# Patient Record
Sex: Female | Born: 1990 | Race: Black or African American | Hispanic: No | Marital: Single | Smoking: Never smoker
Health system: Southern US, Community
[De-identification: ages and names within clinical notes are randomized; demographics above are authoritative.]

## PROBLEM LIST (undated history)

## (undated) DIAGNOSIS — R569 Unspecified convulsions: Secondary | ICD-10-CM

## (undated) HISTORY — PX: NO PAST SURGERIES: SHX2092

---
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
## 2008-01-11 ENCOUNTER — Emergency Department: Payer: Self-pay | Admitting: Internal Medicine

## 2015-08-04 ENCOUNTER — Ambulatory Visit
Admission: EM | Admit: 2015-08-04 | Discharge: 2015-08-04 | Disposition: A | Discharge status: Home / Self Care | Payer: BLUE CROSS/BLUE SHIELD | Attending: Family Medicine | Admitting: Family Medicine

## 2015-08-04 DIAGNOSIS — J029 Acute pharyngitis, unspecified: Secondary | ICD-10-CM

## 2015-08-04 HISTORY — DX: Unspecified convulsions: R56.9

## 2015-08-04 LAB — RAPID STREP SCREEN (MED CTR MEBANE ONLY): Streptococcus, Group A Screen (Direct): NEGATIVE

## 2015-08-04 NOTE — Discharge Instructions (Signed)
°  Tylenol and ibuprofen aternating  Increase fluids  Steamy showers Gargle with warm salt water or dilute mouthwash Rest   Call back 3 days for Strep final report    Pharyngitis Pharyngitis is redness, pain, and swelling (inflammation) of your pharynx.  CAUSES  Pharyngitis is usually caused by infection. Most of the time, these infections are from viruses (viral) and are part of a cold. However, sometimes pharyngitis is caused by bacteria (bacterial). Pharyngitis can also be caused by allergies. Viral pharyngitis may be spread from person to person by coughing, sneezing, and personal items or utensils (cups, forks, spoons, toothbrushes). Bacterial pharyngitis may be spread from person to person by more intimate contact, such as kissing.  SIGNS AND SYMPTOMS  Symptoms of pharyngitis include:   Sore throat.   Tiredness (fatigue).   Low-grade fever.   Headache.  Joint pain and muscle aches.  Skin rashes.  Swollen lymph nodes.  Plaque-like film on throat or tonsils (often seen with bacterial pharyngitis). DIAGNOSIS  Your health care provider will ask you questions about your illness and your symptoms. Your medical history, along with a physical exam, is often all that is needed to diagnose pharyngitis. Sometimes, a rapid strep test is done. Other lab tests may also be done, depending on the suspected cause.  TREATMENT  Viral pharyngitis will usually get better in 3-4 days without the use of medicine. Bacterial pharyngitis is treated with medicines that kill germs (antibiotics).  HOME CARE INSTRUCTIONS   Drink enough water and fluids to keep your urine clear or pale yellow.   Only take over-the-counter or prescription medicines as directed by your health care provider:   If you are prescribed antibiotics, make sure you finish them even if you start to feel better.   Do not take aspirin.   Get lots of rest.   Gargle with 8 oz of salt water ( tsp of salt per 1 qt of  water) as often as every 1-2 hours to soothe your throat.   Throat lozenges (if you are not at risk for choking) or sprays may be used to soothe your throat. SEEK MEDICAL CARE IF:   You have large, tender lumps in your neck.  You have a rash.  You cough up green, yellow-brown, or bloody spit. SEEK IMMEDIATE MEDICAL CARE IF:   Your neck becomes stiff.  You drool or are unable to swallow liquids.  You vomit or are unable to keep medicines or liquids down.  You have severe pain that does not go away with the use of recommended medicines.  You have trouble breathing (not caused by a stuffy nose). MAKE SURE YOU:   Understand these instructions.  Will watch your condition.  Will get help right away if you are not doing well or get worse.   This information is not intended to replace advice given to you by your health care provider. Make sure you discuss any questions you have with your health care provider.   Document Released: 06/13/2005 Document Revised: 04/03/2013 Document Reviewed: 02/18/2013 Elsevier Interactive Patient Education [employer]

## 2015-08-04 NOTE — ED Provider Notes (Signed)
CSN: [id number]     Arrival date & time 08/04/15  1140 History   First MD Initiated Contact with Patient 08/04/15 1207     Chief Complaint  Patient presents with  . Sore Throat   (Consider location/radiation/quality/duration/timing/severity/associated sxs/prior Treatment) HPI  25 yo F with sore throat since Saturday. Has uses ibuprofen and tylenol -reports fever of 101-102 during weekend  she denies hx of post nasal drip/seasonal allergies but mother feels she does a lot of throat clearing normally. Mom and dad both have allergies. 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 No follow up. Is aware of feeling weak , unable to move extremities, but can hear everybody - and then it passes And "I come back". Was never evaluated-no studies were done and no medications prescribed per mother and daughter  - was told to "get other activities and interventions to reduce stress"  Has never seen PCP Has a New appointmnt reportedly scheduled for May 9     Past Medical History  Diagnosis Date  . Seizures (HCC)    History reviewed. No pertinent past surgical history. Family History  Problem Relation Age of Onset  . Diabetes Mother   . Hypertension Mother   . Osteoarthritis Mother   . Stroke Father    Social History  Substance Use Topics  . Smoking status: Never Smoker   . Smokeless tobacco: None  . Alcohol Use: No   OB History    No data available     Review of Systems.Review of 10 systems negative for acute change except as referenced in HPI   Allergies  Latex  Home Medications   Prior to Admission medications   Not on File   Meds Ordered and Administered this Visit  Medications - No data to display  BP 112/80 mmHg  Pulse 110  Temp(Src)  98.4 F (36.9 C) (Tympanic)  Resp 16  Ht  (1.626 m)  Wt 107 lb (48.535 kg)  BMI 18.36 kg/m2  SpO2 100%  LMP 07/20/2015 (Exact Date) No data found.   Physical Exam   Constitutional -alert and oriented, tired appearing, mild distress, seated on exam table talking, presents younger than stated age  General: Mild distress, VS noted, p 110 Afebrile Head-atraumatic, normocephalic Eyes- conjunctiva normal, EOMI ,conjugate gaze Ears: grossly normal hearing; left TM with bubbles visible, no erythema, no discomfort Nose- no congestion or rhinorrhea Mouth/throat- mucous membranes moist ,oropharynx mild erythematous  STAT strep negative-, Neck- supple with mild shotty glandular enlargement, neg posterior chain; CV- p 110 , repeated at 88  reg rhythmn Resp-no distress, normal respiratory effort,clear to auscultation bilaterally GI- soft,non-tender,no distention GU- not examined MSK- non tender, normal ROM, ambulatory, no gait instability,on /off table solo Neuro- normal speech and language, no gross focal neurological deficit appreciated,  Skin-warm,dry ,intact;  Psych-mood and affect grossly normal; speech and behavior grossly normal  ED Course  Procedures (including critical care time)  Labs Review Labs Reviewed  RAPID STREP SCREEN (NOT AT [facility]  CULTURE, GROUP A STREP [facility])  Stat strep neg  Imaging Review No results found.     Mother and daughter are encouraged to use a trial of loratadine and flonase to help open the eustachian tubes and clear her left ear. Viral syndrome discussed--pharyngitis. Interventions- comfortt measures  Encouraged them to get connected with a PCP and the [facility] records she referenced today and be re-evaluated. Mom has never felt anything needed to be evaluated as daughter more aware of episodes than she was. Encouraged to have a professional review the records, interview and examine for completeness- though  She has grown up and generally done well  her reported history needs to be clarified ...      MDM   1. Pharyngitis    .Plan: Test/x-ray results and diagnosis reviewed with patient/ parent _  Call in 3 days for final strep report   Rx as per orders;  benefits, risks, potential side effects reviewed   Recommend supportive treatment with cyclic tylenol and ibuprofen Seek additional medical care if symptoms worsen or are not improving      Rae Halsted, PA-C 08/06/15 2023

## 2015-08-04 NOTE — ED Notes (Signed)
C/o very sore throat since Saturday with bilateral ear pain. Hx of "stress seizures" and states had a seizure last evening

## 2015-08-06 ENCOUNTER — Telehealth: Payer: Self-pay

## 2015-08-06 ENCOUNTER — Telehealth: Payer: Self-pay | Admitting: Emergency Medicine

## 2015-08-06 ENCOUNTER — Encounter: Payer: Self-pay | Admitting: Physician Assistant

## 2015-08-06 LAB — CULTURE, GROUP A STREP (THRC)

## 2015-08-06 NOTE — ED Notes (Signed)
Patient called and informed + Group C strep and that antibiotic has been called to [facility]

## 2015-08-06 NOTE — ED Notes (Signed)
Amoxicillin  1 tablet twice a day for 10 days with no refills per Dr. Thurmond Butts was [facility] by G.W. Dewaine Conger RN.

## 2019-12-26 ENCOUNTER — Other Ambulatory Visit: Payer: Self-pay

## 2019-12-26 ENCOUNTER — Ambulatory Visit
Admission: RE | Admit: 2019-12-26 | Discharge: 2019-12-26 | Disposition: A | Discharge status: Home / Self Care | Payer: BLUE CROSS/BLUE SHIELD | Source: Ambulatory Visit | Attending: Family Medicine | Admitting: Family Medicine

## 2019-12-26 VITALS — BP 127/77 | HR 94 | Temp 98.4°F | Resp 18 | Ht 64.0 in | Wt 106.9 lb

## 2019-12-26 DIAGNOSIS — M5442 Lumbago with sciatica, left side: Secondary | ICD-10-CM | POA: Diagnosis not present

## 2019-12-26 DIAGNOSIS — T148XXA Other injury of unspecified body region, initial encounter: Secondary | ICD-10-CM | POA: Diagnosis not present

## 2019-12-26 MED ORDER — CYCLOBENZAPRINE HCL 10 MG PO TABS: ORAL_TABLET | ORAL | 0 refills | Status: AC

## 2019-12-26 MED ORDER — NAPROXEN 500 MG PO TBEC: 500.0000 mg | DELAYED_RELEASE_TABLET | Freq: Two times a day (BID) | ORAL | 0 refills | Status: AC | PRN

## 2019-12-26 NOTE — ED Provider Notes (Signed)
[facility]    CSN: [id number] Arrival date & time: 12/26/19  0847      History   Chief Complaint Chief Complaint  Patient presents with  . Appointment  . Back Pain    HPI Ashley Bridges is a 29 y.o. female.   29 year old female presents with mainly left sided back pain that started about 1 week ago. Has become worse in the past 2 days. Yesterday the pain started radiating down into her left buttock and left leg. Today her left leg looked more swollen. Denies any fever, cough, GI symptoms or any change in bladder or bowel pattern. No numbness. 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 Has not taken any medication for pain or symptoms. No previous history of back pain. No other chronic health issues. Takes [facility] OCP's daily.   The history is provided by the patient.    Past Medical History:  Diagnosis Date  . Seizures (HCC)     There are no problems to display for this patient.   Past Surgical History:  Procedure Laterality Date  . NO PAST SURGERIES      OB History   No obstetric history on file.      Home Medications    Prior to Admission medications   Medication Sig Start Date End Date Taking? Authorizing Provider  levonorgestrel-ethinyl estradiol (ALESSE) 0.1-20 MG-MCG tablet Take 1 tablet by mouth daily. 10/07/19 10/06/20 Yes [provider]  cyclobenzaprine (FLEXERIL) 10 MG tablet Take 1/2 tablet to 1 whole tablet by mouth every 8 hours as needed for muscle spasms/pain. 12/26/19   Sudie Grumbling, NP  naproxen (EC NAPROSYN) 500 MG EC tablet Take 1 tablet (500 mg total) by mouth 2 (two) times daily as needed (for pain). 12/26/19   Sudie Grumbling, NP    Family History Family History  Problem Relation Age of Onset  . Diabetes Mother   . Hypertension Mother   . Osteoarthritis Mother   . Stroke Father     Social History Social History   Tobacco Use   . Smoking status: Never Smoker  . Smokeless tobacco: Never Used  Vaping Use  . Vaping Use: Never used  Substance Use Topics  . Alcohol use: No  . Drug use: Not Currently     Allergies   Latex   Review of Systems Review of Systems  Constitutional: Negative for activity change, appetite change, chills, fatigue and fever.  Respiratory: Negative for cough, chest tightness, shortness of breath and wheezing.   Cardiovascular: Negative for chest pain.  Gastrointestinal: Negative for diarrhea, nausea and vomiting.  Genitourinary: Negative for decreased urine volume, difficulty urinating, flank pain and hematuria.  Musculoskeletal: Positive for back pain and myalgias. Negative for arthralgias, gait problem, joint swelling, neck pain and neck stiffness.  Skin: Negative for color change, rash and wound.  Allergic/Immunologic: Negative for environmental allergies, food allergies and immunocompromised state.  Neurological: Negative for dizziness, tremors, seizures, syncope, weakness, light-headedness, numbness and headaches.  Hematological: Negative for adenopathy. Does not bruise/bleed easily.     Physical Exam Triage Vital Signs ED Triage Vitals  Enc Vitals Group     BP 12/26/19 0902 127/77     Pulse Rate 12/26/19 0902 94     Resp 12/26/19 0902 18     Temp 12/26/19 0902 98.4 F (36.9 C)     Temp Source 12/26/19 0902 Oral  SpO2 12/26/19 0902 100 %     Weight 12/26/19 0859 106 lb 14.8 oz (48.5 kg)     Height 12/26/19 0859 5\' 4"  (1.626 m)     Head Circumference --      Peak Flow --      Pain Score 12/26/19 0859 5     Pain Loc --      Pain Edu? --      Excl. in GC? --    No data found.  Updated Vital Signs BP 127/77 (BP Location: Left Arm)   Pulse 94   Temp 98.4 F (36.9 C) (Oral)   Resp 18   Ht 5\' 4"  (1.626 m)   Wt 106 lb 14.8 oz (48.5 kg)   LMP 12/24/2019   SpO2 100%   BMI 18.35 kg/m   Visual Acuity Right Eye Distance:   Left Eye Distance:   Bilateral  Distance:    Right Eye Near:   Left Eye Near:    Bilateral Near:     Physical Exam Vitals and nursing note reviewed.  Constitutional:      General: She is awake. She is not in acute distress.    Appearance: She is well-developed and well-groomed.     Comments: She is sitting in the exam chair in no acute distress but appears uncomfortable due to pain.   HENT:     Head: Normocephalic and atraumatic.  Eyes:     Extraocular Movements: Extraocular movements intact.     Conjunctiva/sclera: Conjunctivae normal.  Cardiovascular:     Rate and Rhythm: Normal rate and regular rhythm.     Heart sounds: Normal heart sounds. No murmur heard.   Pulmonary:     Effort: Pulmonary effort is normal. No respiratory distress.     Breath sounds: Normal breath sounds and air entry. No decreased air movement. No decreased breath sounds, wheezing, rhonchi or rales.  Abdominal:     General: Abdomen is flat. Bowel sounds are normal. There is no distension.     Palpations: Abdomen is soft. There is no mass.     Tenderness: There is no abdominal tenderness. There is no right CVA tenderness, left CVA tenderness, guarding or rebound.  Musculoskeletal:        General: Normal range of motion.     Cervical back: Normal and normal range of motion. No rigidity.     Thoracic back: Normal.     Lumbar back: Spasms present. No swelling, edema, deformity or tenderness. Normal range of motion. Negative right straight leg raise test.       Back:     Right lower leg: No edema.     Left lower leg: No edema.     Comments: Has full range of motion of back but pain with flexion and rotation. No distinct tenderness present. No swelling or redness. No rash. Slight muscle spasm present on left lower lumbar area (latissimus dorsi). No neuro deficits noted. Good distal pulses and capillary refill.  No distinct leg swelling present.   Skin:    General: Skin is warm and dry.     Capillary Refill: Capillary refill takes less than  2 seconds.     Findings: No rash.  Neurological:     General: No focal deficit present.     Mental Status: She is alert and oriented to person, place, and time.     Sensory: Sensation is intact. No sensory deficit.     Motor: Motor function is intact.  Gait: Gait is intact.     Deep Tendon Reflexes: Reflexes are normal and symmetric.  Psychiatric:        Mood and Affect: Mood normal.        Behavior: Behavior normal. Behavior is cooperative.        Thought Content: Thought content normal.        Judgment: Judgment normal.      UC Treatments / Results  Labs (all labs ordered are listed, but only abnormal results are displayed) Labs Reviewed - No data to display  EKG   Radiology No results found.  Procedures Procedures (including critical care time)  Medications Ordered in UC Medications - No data to display  Initial Impression / Assessment and Plan / UC Course  I have reviewed the triage vital signs and the nursing notes.  Pertinent labs & imaging results that were available during my care of the patient were reviewed by me and considered in my medical decision making (see chart for details).    Reviewed with patient that she probably has a lumbar muscle strain with spasms. Do not feel imaging is indicated at this time. Discussed that she may have a mild sciatica with pain traveling down her leg. Recommend conservative treatments at this time. Discussed that repetitive motions may have contributed to her symptoms- recommend avoid lifting and bending for the next 3 days.  Recommend trial Naproxen 500mg  twice a day as directed for pain. May take Flexeril 10mg  1/2 to 1 whole tablet every 8 hours as needed - use mainly at night. Apply warm compresses to area for comfort. Information provided on stretching exercises for the back. Follow-up in 5 to 7 days if not improving.  Final Clinical Impressions(s) / UC Diagnoses   Final diagnoses:  Acute left-sided low back pain with  left-sided sciatica  Muscle strain     Discharge Instructions     Recommend start Naproxen 500mg  twice a day as directed for pain. May take Flexeril muscle relaxer 1/2 to 1 whole tablet every 8 hours as needed- use mainly at night. Continue to stretch back muscles. May apply warm compress to area for comfort. Follow-up in 5 to 7 days if not improving.     ED Prescriptions    Medication Sig Dispense Auth. Provider   naproxen (EC NAPROSYN) 500 MG EC tablet Take 1 tablet (500 mg total) by mouth 2 (two) times daily as needed (for pain). 20 tablet , NP   cyclobenzaprine (FLEXERIL) 10 MG tablet Take 1/2 tablet to 1 whole tablet by mouth every 8 hours as needed for muscle spasms/pain. 15 tablet Darrik Richman, , NP     PDMP not reviewed this encounter.   , NP 12/27/19 1948

## 2019-12-26 NOTE — ED Triage Notes (Signed)
Pt c/o lower back pain that radiates down through her buttock and down her left leg. Started about a week ago. She states this morning her left leg was swollen. She has not tried anything for her symptoms.

## 2019-12-26 NOTE — Discharge Instructions (Signed)
Recommend start Naproxen 500mg  twice a day as directed for pain. May take Flexeril muscle relaxer 1/2 to 1 whole tablet every 8 hours as needed- use mainly at night. Continue to stretch back muscles. May apply warm compress to area for comfort. Follow-up in 5 to 7 days if not improving.

## 2023-12-19 ENCOUNTER — Ambulatory Visit: Payer: Self-pay

## 2023-12-19 DIAGNOSIS — K640 First degree hemorrhoids: Secondary | ICD-10-CM | POA: Diagnosis not present

## 2023-12-19 DIAGNOSIS — K625 Hemorrhage of anus and rectum: Secondary | ICD-10-CM | POA: Diagnosis present

## 2023-12-19 DIAGNOSIS — K5730 Diverticulosis of large intestine without perforation or abscess without bleeding: Secondary | ICD-10-CM | POA: Diagnosis not present

## 2023-12-19 DIAGNOSIS — K2950 Unspecified chronic gastritis without bleeding: Secondary | ICD-10-CM | POA: Diagnosis not present

## 2023-12-27 ENCOUNTER — Inpatient Hospital Stay: Attending: Oncology | Admitting: Licensed Clinical Social Worker

## 2023-12-27 ENCOUNTER — Other Ambulatory Visit: Payer: Self-pay | Admitting: Licensed Clinical Social Worker

## 2023-12-27 ENCOUNTER — Encounter: Payer: Self-pay | Admitting: Licensed Clinical Social Worker

## 2023-12-27 ENCOUNTER — Inpatient Hospital Stay

## 2023-12-27 DIAGNOSIS — Z803 Family history of malignant neoplasm of breast: Secondary | ICD-10-CM | POA: Diagnosis not present

## 2023-12-27 DIAGNOSIS — Z8041 Family history of malignant neoplasm of ovary: Secondary | ICD-10-CM

## 2023-12-27 DIAGNOSIS — Z800 Family history of malignant neoplasm of digestive organs: Secondary | ICD-10-CM | POA: Diagnosis not present

## 2023-12-27 DIAGNOSIS — Z808 Family history of malignant neoplasm of other organs or systems: Secondary | ICD-10-CM

## 2023-12-27 DIAGNOSIS — Z8042 Family history of malignant neoplasm of prostate: Secondary | ICD-10-CM

## 2023-12-27 LAB — GENETIC SCREENING ORDER

## 2023-12-27 NOTE — Progress Notes (Signed)
 REFERRING PROVIDER: Jane Delmar Pike, NP [address] [facility] [place],  [place] [address]  PRIMARY PROVIDER:  Patient, No Pcp Per  PRIMARY REASON FOR VISIT:  1. Family history of breast cancer   2. Family history of ovarian cancer   3. Family history of colon cancer   4. Family history of pancreatic cancer   5. Family history of prostate cancer   6. Family history of brain cancer      HISTORY OF PRESENT ILLNESS:   [name], a 33 y.o. female, was seen for a [place] cancer genetics consultation at the request of Dr. Jane due to a family history of cancer.  [name] presents to clinic today to discuss the possibility of a hereditary predisposition to cancer, genetic testing, and to further clarify her future cancer risks, as well as potential cancer risks for family members.   CANCER HISTORY:  [name] is a 33 y.o. female with no personal history of cancer.    RISK FACTORS:  Menarche was at age 67.  Ovaries intact: yes.  Hysterectomy: no.  Menopausal status: premenopausal.  Colonoscopy: yes; reports 3 total, no polyps. Mammogram within the last year: n/a. Number of breast biopsies: 0. Up to date with pelvic exams: yes.  Past Medical History:  Diagnosis Date   Seizures (HCC)     Past Surgical History:  Procedure Laterality Date   NO PAST SURGERIES      FAMILY HISTORY:  We obtained a detailed, 4-generation family history.  Significant diagnoses are listed below: Family History  Problem Relation Age of Onset   Diabetes Mother    Hypertension Mother    Osteoarthritis Mother    Stroke Father    Breast cancer Maternal Aunt 56   Pancreatic cancer Maternal Aunt 60   Colon cancer Maternal Aunt 23   Brain cancer Maternal Uncle 30       agent orange exp   Breast cancer Maternal Grandmother 34   Colon cancer Maternal Grandfather 60   Prostate cancer Maternal Grandfather 60   Ovarian cancer Paternal Grandmother 39   Prostate  cancer Paternal Grandfather 87   [name] has 2 full sisters, 1 maternal half sister, 3 paternal half sisters and 1 paternal half brother, none have had cancer. Her two full sisters have both had 1-2 colon polyps on colonoscopies. One of her sisters reportedly had genetic testing that sounds like came back with VUS.   [name]'s mother is living at 55 and has had hysterectomy in her 94s-50s. Patient's maternal aunt had colon cancer at 18 and died at 25. Another aunt had breast cancer at 58, pancreatic cancer at 58 and died at 3. An uncle had brain cancer at 32, of note he had exposure to agent orange. Patient's maternal grandmother had breast cancer at 69 and died at 39. Grandfather had colon and prostate cancer at 71 and died at 44.   [name]'s father is living at 107. Patient's paternal grandmother had ovarian cancer at 32 and died at 69. Grandfather had prostate cancer at 8 and passed at 51.   [name] is unaware of previous family history of genetic testing for hereditary cancer risks. There is no reported Ashkenazi Jewish ancestry. There is no known consanguinity.    GENETIC COUNSELING ASSESSMENT: [name] is a 33 y.o. female with a family history of ovarian cancer in her paternal grandmother, early onset breast cancer in her maternal aunt and grandmother which is somewhat suggestive  of a hereditary cancer syndrome and predisposition to cancer. We, therefore, discussed and recommended the following at today's visit.   DISCUSSION: We discussed that approximately 10% of breast cancer is hereditary, 10-20% of ovarian cancer is hereditary. Most cases of hereditary breast/ovarian cancer are associated with BRCA1/2 genes, which can also increase risk for prostate and pancreatic cancer, although there are other genes associated with hereditary cancer as well. Cancers and risks are gene specific. We discussed that testing is beneficial for several reasons including knowing about  cancer risks, identifying potential screening and risk-reduction options that may be appropriate, and to understand if other family members could be at risk for cancer and allow them to undergo genetic testing.   We reviewed the characteristics, features and inheritance patterns of hereditary cancer syndromes. We also discussed genetic testing, including the appropriate family members to test, the process of testing, insurance coverage and turn-around-time for results. We discussed the implications of a negative, positive and/or variant of uncertain significant result. We recommended [name] pursue genetic testing for the [facility] CancerNext-Expanded+RNA gene panel.   The CancerNext-Expanded gene panel offered by [facility] and includes sequencing, rearrangement, and RNA analysis for the following 77 genes: AIP, ALK, APC, ATM, AXIN2, BAP1, BARD1, BMPR1A, BRCA1, BRCA2, BRIP1, CDC73, CDH1, CDK4, CDKN1B, CDKN2A, CEBPA, CHEK2, CTNNA1, DDX41, DICER1, ETV6, FH, FLCN, GATA2, LZTR1, MAX, MBD4, MEN1, MET, MLH1, MSH2, MSH3, MSH6, MUTYH, NF1, NF2, NTHL1, PALB2, PHOX2B, PMS2, POT1, PRKAR1A, PTCH1, PTEN, RAD51C, RAD51D, RB1, RET, RPS20, RUNX1, SDHA, SDHAF2, SDHB, SDHC, SDHD, SMAD4, SMARCA4, SMARCB1, SMARCE1, STK11, SUFU, TMEM127, TP53, TSC1, TSC2, VHL, and WT1 (sequencing and deletion/duplication); EGFR, HOXB13, KIT, MITF, PDGFRA, POLD1, and POLE (sequencing only); EPCAM and GREM1 (deletion/duplication only).   Based on [name]'s family history of cancer, she meets medical criteria for genetic testing. Though [name] is not personally affected, there are no affected family members that are willing/able to undergo hereditary cancer testing.  Therefore, [name] the most informative family member available. Despite that she meets criteria, she may still have an out of pocket cost.   We discussed that some people do not want to undergo genetic testing due to fear of genetic discrimination.  A federal  law called the Genetic Information Non-Discrimination Act (GINA) of 2008 helps protect individuals against genetic discrimination based on their genetic test results.  It impacts both health insurance and employment.  For health insurance, it protects against increased premiums, being kicked off insurance or being forced to take a test in order to be insured.  For employment it protects against hiring, firing and promoting decisions based on genetic test results.  Health status due to a cancer diagnosis is not protected under GINA.  This law does not protect life insurance, disability insurance, or other types of insurance.   PLAN: After considering the risks, benefits, and limitations, [name] provided informed consent to pursue genetic testing and the blood sample was sent to [facility] for analysis of the CancerNext-Expanded+RNA panel. Results should be available within approximately 2-3 weeks' time, at which point they will be disclosed by MyChart message to [name], as will any additional recommendations warranted by these results. [name] will receive a summary of her genetic counseling visit and a copy of her results once available. This information will also be available in Epic.   [name]'s questions were answered to her satisfaction today. Our contact information was provided should additional questions or concerns arise. Thank you for the referral and allowing us   to share in the care of your patient.   Dena Cary, MS, [facility] Genetic Counselor [email or web address] Phone: [phone]  45 minutes were spent on the date of the encounter in service to the patient including preparation, face-to-face consultation, documentation and care coordination. Dr. Delinda was available for discussion regarding this case.   _______________________________________________________________________ For Office Staff:  Number of people involved in session: 1 Was an  Intern/ student involved with case: no

## 2024-01-09 ENCOUNTER — Ambulatory Visit: Payer: Self-pay | Admitting: Licensed Clinical Social Worker

## 2024-01-09 ENCOUNTER — Telehealth: Payer: Self-pay | Admitting: Licensed Clinical Social Worker

## 2024-01-09 DIAGNOSIS — Z1379 Encounter for other screening for genetic and chromosomal anomalies: Secondary | ICD-10-CM | POA: Insufficient documentation

## 2024-01-09 NOTE — Telephone Encounter (Signed)
 I contacted Ms. Baine to discuss her genetic testing results. No pathogenic variants were identified in the 77 genes analyzed. Detailed clinic note to follow.   The test report has been scanned into EPIC and is located under the Molecular Pathology section of the Results Review tab.  A portion of the result report is included below for reference.      Dena Cary, MS, [facility] Genetic Counselor [email or web address] Phone: [phone]

## 2024-01-09 NOTE — Progress Notes (Signed)
 HPI:   Ashley Bridges was previously seen in the [facility] due to a family history of cancer and concerns regarding a hereditary predisposition to cancer. Please refer to our prior cancer genetics clinic note for more information regarding our discussion, assessment and recommendations, at the time. Ashley Bridges's recent genetic test results were disclosed to her, as were recommendations warranted by these results. These results and recommendations are discussed in more detail below.  CANCER HISTORY:  Oncology History   No history exists.    FAMILY HISTORY:  We obtained a detailed, 4-generation family history.  Significant diagnoses are listed below: Family History  Problem Relation Age of Onset   Diabetes Mother    Hypertension Mother    Osteoarthritis Mother    Stroke Father    Breast cancer Maternal Aunt 86   Pancreatic cancer Maternal Aunt 60   Colon cancer Maternal Aunt 24   Brain cancer Maternal Uncle 30       agent orange exp   Breast cancer Maternal Grandmother 84   Colon cancer Maternal Grandfather 60   Prostate cancer Maternal Grandfather 60   Ovarian cancer Paternal Grandmother 26   Prostate cancer Paternal Grandfather 36   Ashley Bridges has 2 full sisters, 1 maternal half sister, 3 paternal half sisters and 1 paternal half brother, none have had cancer. Her two full sisters have both had 1-2 colon polyps on colonoscopies. One of her sisters reportedly had genetic testing that sounds like came back with VUS.    Ashley Bridges's mother is living at 25 and has had hysterectomy in her 77s-50s. Patient's maternal aunt had colon cancer at 4 and died at 1. Another aunt had breast cancer at 28, pancreatic cancer at 66 and died at 65. An uncle had brain cancer at 65, of note he had exposure to agent orange. Patient's maternal grandmother had breast cancer at 30 and died at 69. Grandfather had colon and prostate cancer at 67 and died at 58.    Ashley Bridges's  father is living at 19. Patient's paternal grandmother had ovarian cancer at 78 and died at 46. Grandfather had prostate cancer at 51 and passed at 63.    Ashley Bridges is unaware of previous family history of genetic testing for hereditary cancer risks. There is no reported Ashkenazi Jewish ancestry. There is no known consanguinity.      GENETIC TEST RESULTS:  The [facility] CancerNext-Expanded+RNA Panel found no pathogenic mutations.   The CancerNext-Expanded gene panel offered by [facility] and includes sequencing, rearrangement, and RNA analysis for the following 77 genes: AIP, ALK, APC, ATM, AXIN2, BAP1, BARD1, BMPR1A, BRCA1, BRCA2, BRIP1, CDC73, CDH1, CDK4, CDKN1B, CDKN2A, CEBPA, CHEK2, CTNNA1, DDX41, DICER1, ETV6, FH, FLCN, GATA2, LZTR1, MAX, MBD4, MEN1, MET, MLH1, MSH2, MSH3, MSH6, MUTYH, NF1, NF2, NTHL1, PALB2, PHOX2B, PMS2, POT1, PRKAR1A, PTCH1, PTEN, RAD51C, RAD51D, RB1, RET, RPS20, RUNX1, SDHA, SDHAF2, SDHB, SDHC, SDHD, SMAD4, SMARCA4, SMARCB1, SMARCE1, STK11, SUFU, TMEM127, TP53, TSC1, TSC2, VHL, and WT1 (sequencing and deletion/duplication); EGFR, HOXB13, KIT, MITF, PDGFRA, POLD1, and POLE (sequencing only); EPCAM and GREM1 (deletion/duplication only).   The test report has been scanned into EPIC and is located under the Molecular Pathology section of the Results Review tab.  A portion of the result report is included below for reference. Genetic testing reported out on 01/06/2024.     Even though a pathogenic variant was not identified, possible explanations for the cancer in the family may include: There may be no hereditary  risk for cancer in the family. The cancers in Ms. Ging and/or her family may be sporadic/familial or due to other genetic and environmental factors. There may be a gene mutation in one of these genes that current testing methods cannot detect but that chance is small. There could be another gene that has not yet been discovered, or that we have not yet tested,  that is responsible for the cancer diagnoses in the family.  It is also possible there is a hereditary cause for the cancer in the family that Ashley Bridges did not inherit.  Therefore, it is important to remain in touch with cancer genetics in the future so that we can continue to offer Ashley Bridges the most up to date genetic testing.   ADDITIONAL GENETIC TESTING:  Ashley Bridges's genetic testing was fairly extensive.  If there are additional relevant genes identified to increase cancer risk that can be analyzed in the future, we would be happy to discuss and coordinate this testing at that time.    CANCER SCREENING RECOMMENDATIONS:  Ashley Bridges's test result is considered negative (normal).  This means that we have not identified a hereditary cause for her family history of cancer at this time.   An individual's cancer risk and medical management are not determined by genetic test results alone. Overall cancer risk assessment incorporates additional factors, including personal medical history, family history, and any available genetic information that may result in a personalized plan for cancer prevention and surveillance. Therefore, it is recommended she continue to follow the cancer management and screening guidelines provided by her primary healthcare provider.  Based on the reported personal and family history, specific cancer screenings for Ashley Bridges and her family include:  Breast Cancer Screening:  The Tyrer-Cuzick model is one of multiple prediction models developed to estimate an individual's lifetime risk of developing breast cancer. The Tyrer-Cuzick model is endorsed by the [employer] (NCCN). This model includes many risk factors such as family history, endogenous estrogen exposure, and benign breast disease. The calculation is highly-dependent on the accuracy of clinical data provided by the patient and can change over time. The Tyrer-Cuzick  model may be repeated to reflect new information in her personal or family history in the future.    Ashley Bridges'sTyrer-Cuzick risk score is 17.9%.  This is above the general population risk of 13%, therefore, she is encouraged to continue to be mindful of her family history and be diligent with general population breast screening, including annual mammograms beginning 10 years prior to the youngest diagnosis in her family or by age 1. She is encouraged to contact us  regarding any changes to her personal or family history, as her recommendations for screening would be altered significantly if her lifetime risk is determined to be greater than 20% based on updated information.     RECOMMENDATIONS FOR FAMILY MEMBERS:   Individuals in this family might be at some increased risk of developing cancer, over the general population risk, due to the family history of cancer.  Individuals in the family should notify their providers of the family history of cancer. We recommend women in this family have a yearly mammogram beginning at age 16, or 53 years younger than the earliest onset of cancer, an annual clinical breast exam, and perform monthly breast self-exams.  Family members should have colonoscopies by at age 65, or earlier, as recommended by their providers. Other members of the family may still carry a pathogenic variant in  one of these genes that Ashley Bridges did not inherit. Based on the family history, we recommend her mother and father (or maternal/paternal relatives) have genetic counseling and testing. Ms. Marion will let us  know if we can be of any assistance in coordinating genetic counseling and/or testing for this family member.    FOLLOW-UP:  Lastly, we discussed with Ashley Bridges that cancer genetics is a rapidly advancing field and it is possible that new genetic tests will be appropriate for her and/or her family members in the future. We encouraged her to remain in contact with cancer  genetics on an annual basis so we can update her personal and family histories and let her know of advances in cancer genetics that may benefit this family.   Our contact number was provided. Ashley Bridges's questions were answered to her satisfaction, and she knows she is welcome to call us  at anytime with additional questions or concerns.    Dena Cary, MS, [facility] Genetic Counselor [email or web address] Phone: [phone]
